# Patient Record
Sex: Female | Born: 2000 | Race: White | Hispanic: No | Marital: Single | Smoking: Never smoker
Health system: Southern US, Community
[De-identification: ages and names within clinical notes are randomized; demographics above are authoritative.]

## PROBLEM LIST (undated history)

## (undated) DIAGNOSIS — R51 Headache: Secondary | ICD-10-CM

## (undated) DIAGNOSIS — F419 Anxiety disorder, unspecified: Secondary | ICD-10-CM

## (undated) DIAGNOSIS — R519 Headache, unspecified: Secondary | ICD-10-CM

## (undated) DIAGNOSIS — K219 Gastro-esophageal reflux disease without esophagitis: Secondary | ICD-10-CM

---
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
## 2019-05-10 ENCOUNTER — Ambulatory Visit: Payer: BC Managed Care – PPO | Attending: Internal Medicine

## 2019-05-10 ENCOUNTER — Other Ambulatory Visit: Payer: Self-pay

## 2019-05-10 DIAGNOSIS — Z20822 Contact with and (suspected) exposure to covid-19: Secondary | ICD-10-CM

## 2019-05-11 LAB — NOVEL CORONAVIRUS, NAA: SARS-CoV-2, NAA: NOT DETECTED

## 2019-06-23 ENCOUNTER — Other Ambulatory Visit: Payer: Self-pay | Admitting: Psychiatry

## 2019-06-23 DIAGNOSIS — F330 Major depressive disorder, recurrent, mild: Secondary | ICD-10-CM

## 2019-06-23 DIAGNOSIS — F411 Generalized anxiety disorder: Secondary | ICD-10-CM

## 2019-06-23 DIAGNOSIS — F410 Panic disorder [episodic paroxysmal anxiety] without agoraphobia: Secondary | ICD-10-CM

## 2019-06-25 NOTE — Telephone Encounter (Signed)
Last seen 09/05/2018 planning follow-up in 5 months reminder at pickup with 30-day refill Pristiq 25 mg taking 3 every morning #90 with no refill

## 2019-06-25 NOTE — Telephone Encounter (Signed)
Last visit 08/2018

## 2019-07-18 ENCOUNTER — Other Ambulatory Visit: Payer: Self-pay

## 2019-07-18 ENCOUNTER — Ambulatory Visit (INDEPENDENT_AMBULATORY_CARE_PROVIDER_SITE_OTHER): Payer: BC Managed Care – PPO | Admitting: Psychiatry

## 2019-07-18 ENCOUNTER — Encounter: Payer: Self-pay | Admitting: Psychiatry

## 2019-07-18 VITALS — Ht 68.0 in | Wt 116.0 lb

## 2019-07-18 DIAGNOSIS — F411 Generalized anxiety disorder: Secondary | ICD-10-CM

## 2019-07-18 DIAGNOSIS — F410 Panic disorder [episodic paroxysmal anxiety] without agoraphobia: Secondary | ICD-10-CM | POA: Diagnosis not present

## 2019-07-18 DIAGNOSIS — F5105 Insomnia due to other mental disorder: Secondary | ICD-10-CM | POA: Diagnosis not present

## 2019-07-18 DIAGNOSIS — F429 Obsessive-compulsive disorder, unspecified: Secondary | ICD-10-CM | POA: Insufficient documentation

## 2019-07-18 DIAGNOSIS — F3341 Major depressive disorder, recurrent, in partial remission: Secondary | ICD-10-CM

## 2019-07-18 DIAGNOSIS — F428 Other obsessive-compulsive disorder: Secondary | ICD-10-CM

## 2019-07-18 MED ORDER — DESVENLAFAXINE SUCCINATE ER 100 MG PO TB24: 100.0000 mg | ORAL_TABLET | Freq: Every day | ORAL | 5 refills | Status: DC

## 2019-07-18 NOTE — Progress Notes (Signed)
[facility]  Patient ID: Janet Jenkins,  MRN: [id number]  PCP: Santa Genera, MD  Date of Evaluation: 07/18/2019 Time spent:25 minutes from 1400 to 1425  Chief Complaint:  Chief Complaint    Anxiety; Panic Attack; Depression; Stress      HISTORY/CURRENT STATUS: Janet Jenkins is seen onsite in office 20 minutes face-to-face individually with consent with epic collateral for psychiatric interview and exam in 57-month evaluation and management of anxiety, depression, compulsive behavior and insomnia interim symptoms.  Patient is 4 months overdue for follow-up though she suggests she only came in because her new therapist of 1 month in [place] for the last 3-4 sessions suggested she needed to do that to adjust medication for compulsive cleaning evident in her dorm room at [employer] state where she is on site this semester after being online last semester of her freshman year.  She plans to continue at [employer] and needs to get to where the dorm room is an asset rather than a vulnerability for her.  Brother is in [place] and supportive, and she comes home frequently to see mother still using the [facility] in [place].  She has continued Pristiq 75 mg every morning but her therapist notes the need for more for generalized anxiety and compulsive cleaning rituals and other OCD symptoms though she is only having a panic attack every other month now quite well controlled.  Her depression is in remission as well.  She may study abroad at the [place] if possible for coronavirus shutdowns having no other definite plans for the summer.  She has worked with a Data processing manager for 6 months getting her lactose intolerance under control and takes Lo Loestrin rather than Nexplanon for dysmenorrhea still with some acne and no menstrual moodiness.  Her legs shake when she tries to sleep as she is anxious over thinking attempting to go to sleep.  Therapy of 3 or 4 sessions is helping.  Sister has new diagnosis of OCD.  She  has no mania, suicidality, psychosis or delirium.  Depression  The patient presents withdepression as a recurrentproblem.The first episode started more than 2 year ago. The onset quality is sudden. The problem occurs intermittently. The current episode of problem has been gradually improvingsince onset.Associated symptoms include decreased concentration,fatigue,lonely, compulsive cleaning, excessive worry with limited symptom panic, obsessional fixations, and sad.The symptoms are aggravated by work stress, social issues and family issues.Past treatments include SSRIs - Selective serotonin reuptake inhibitors, other medications and psychotherapy.Compliance with treatment is variable.Past compliance problems include difficulty with treatment plan, medication issues and difficulty understanding directions.Previous treatment provided moderaterelief.Risk factors include family history of mental illness, a change in medications, a change in medication usage/dosage, major life event, the patient not taking medications correctly and stress. Past medical history includes brain trauma,anxiety,depressionand mental health disorder. Pertinent negatives include no fibromyalgia,no thyroid problem,no recent psychiatric admission,no bipolar disorder,no eating disorder,no post-traumatic stress disorder,no schizophreniaand no head trauma.   Individual Medical History/ Review of Systems: Changes? :Yes  She has worked with a dietitian for 6 months getting her lactose intolerance under control and takes low Loestrin rather than Nexplanon for dysmenorrhea still with some acne and no menstrual moodiness.  Weight is down 1 pound from 10 months ago.   Allergies: Patient has no known allergies.  Current Medications:  Current Outpatient Medications:  .  acetaminophen (TYLENOL) 325 MG tablet, Take 650 mg by mouth every 6 (six) hours as needed for mild pain or fever., Disp: , Rfl:  .   desvenlafaxine (PRISTIQ)  100 MG 24 hr tablet, Take 1 tablet (100 mg total) by mouth daily after breakfast., Disp: 30 tablet, Rfl: 5 .  etonogestrel (NEXPLANON) 68 MG IMPL implant, 1 each by Subdermal route once., Disp: , Rfl:  .  LO LOESTRIN FE 1 MG-10 MCG / 10 MCG tablet, TK 1 T PO QD, Disp: , Rfl:  .  ondansetron (ZOFRAN-ODT) 8 MG disintegrating tablet, ondansetron 8 mg disintegrating tablet, Disp: , Rfl:  .  tretinoin (RETIN-A) 0.1 % cream, Apply 1 application topically every morning., Disp: , Rfl:   Medication Side Effects: none  Family Medical/ Social History: Changes? None  MENTAL HEALTH EXAM:  Height 5\' 8"  (1.727 m), weight 116 lb (52.6 kg), unknown if currently breastfeeding.Body mass index is 17.64 kg/m. Muscle strengths and tone 5/5, postural reflexes and gait 0/0, and AIMS = 0 otherwise deferred for coronavirus shutdown  General Appearance: Casual, Fairly Groomed and Guarded  Eye Contact:  Good  Speech:  Clear and Coherent, Normal Rate and Talkative  Volume:  Normal  Mood:  Anxious, Dysphoric and Euthymic  Affect:  Congruent, Inappropriate, Restricted and Anxious  Thought Process:  Coherent, Goal Directed, Irrelevant and Descriptions of Associations: Circumstantial  Orientation:  Full (Time, Place, and Person)  Thought Content: Ilusions, Obsessions and Rumination   Suicidal Thoughts:  No  Homicidal Thoughts:  No  Memory:  Immediate;   Good Remote;   Good  Judgement:  Fair  Insight:  Fair  Psychomotor Activity:  Normal, Mannerisms and Restlessness  Concentration:  Concentration: Good and Attention Span: Good  Recall:  [place] of Knowledge: Good  Language: Good  Assets:  Leisure Time Resilience Talents/Skills  ADL's:  Intact  Cognition: WNL  Prognosis:  Good    DIAGNOSES:    ICD-10-CM   1. Generalized anxiety disorder  F41.1 desvenlafaxine (PRISTIQ) 100 MG 24 hr tablet  2. Panic disorder  F41.0 desvenlafaxine (PRISTIQ) 100 MG 24 hr tablet  3. Other  obsessive-compulsive disorders  F42.8 desvenlafaxine (PRISTIQ) 100 MG 24 hr tablet  4. 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 Depression, major, recurrent, in partial remission ([place])  F33.41     Receiving Psychotherapy: Yes Therapist in [place] for 3-4 sessions not giving name.   RECOMMENDATIONS: Over 50% of the 25-minute time for a total of 15 minutes is spent in counseling and coordination of care reworking all aspects of past involving diagnoses and therapeutic interventions.  The patient changed her pattern of treatment from somatic and psychological decompensations assimilating severe depression to now becoming infrequent in her appointments taking 1 medication and expecting it to cover all of her problems.  Her other OCD and generalized anxiety with panic disorder are currently chief symptomatic complaints with depression and insomnia better.  She gradually agrees to increase Pristiq to 100 mg every morning sent as a month supply and 5 refills to [facility].  She continues her birth control pill and other symptomatic medications including for acne.  Sister now has OCD. Patient's shaking legs interfere with class and other activities relative to generalized anxiety and prepanic.  She returns in 6 months for follow-up or sooner if willing having been overdue for appointments the last few times.Delight Hoh, MD

## 2019-07-28 ENCOUNTER — Other Ambulatory Visit: Payer: Self-pay | Admitting: Psychiatry

## 2019-07-28 DIAGNOSIS — F330 Major depressive disorder, recurrent, mild: Secondary | ICD-10-CM

## 2019-07-28 DIAGNOSIS — F411 Generalized anxiety disorder: Secondary | ICD-10-CM

## 2019-07-28 DIAGNOSIS — F410 Panic disorder [episodic paroxysmal anxiety] without agoraphobia: Secondary | ICD-10-CM

## 2019-09-01 IMAGING — US US ABDOMEN COMPLETE
1 series · 14 of 25 positions shown · non-contrast
Comparison: None.

CLINICAL DATA: Abdominal pain

EXAM:
ABDOMEN ULTRASOUND COMPLETE

[Series 1: us abdomen complete · 0.12mm/px · 14 of 79 slices shown]
[im 1/79]
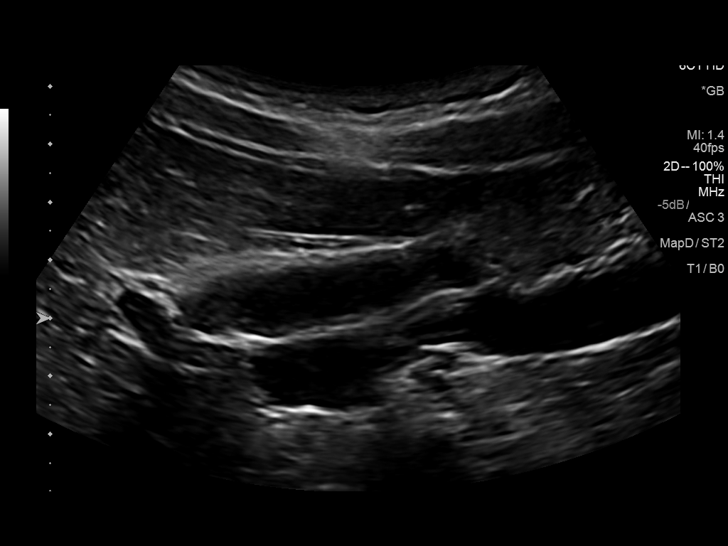
[im 7/79]
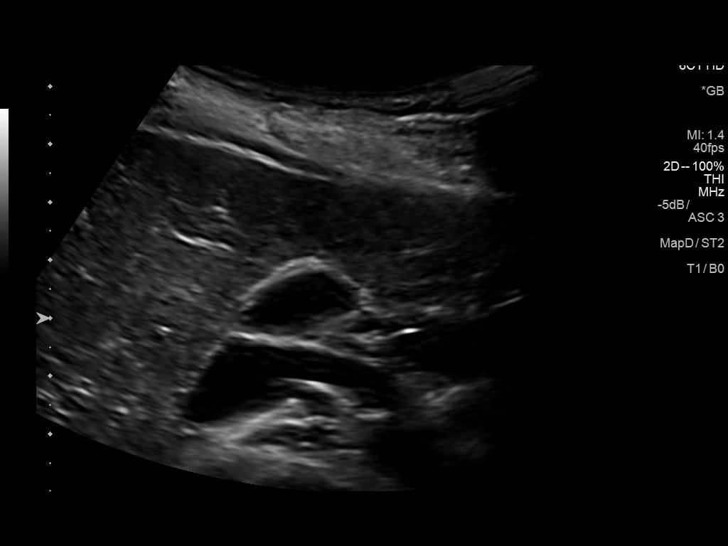
[im 14/79]
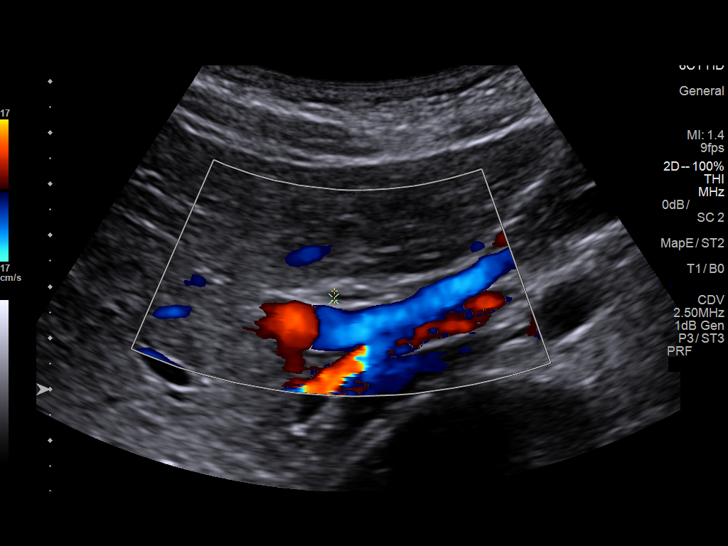
[im 20/79]
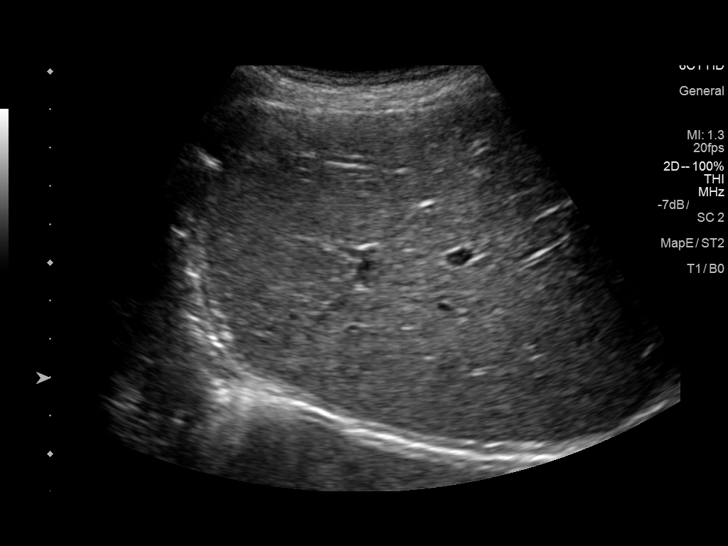
[im 27/79]
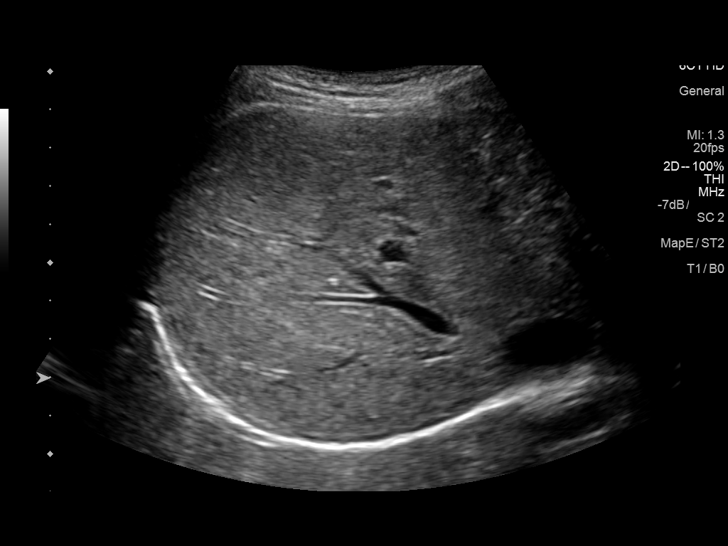
[im 30/79]
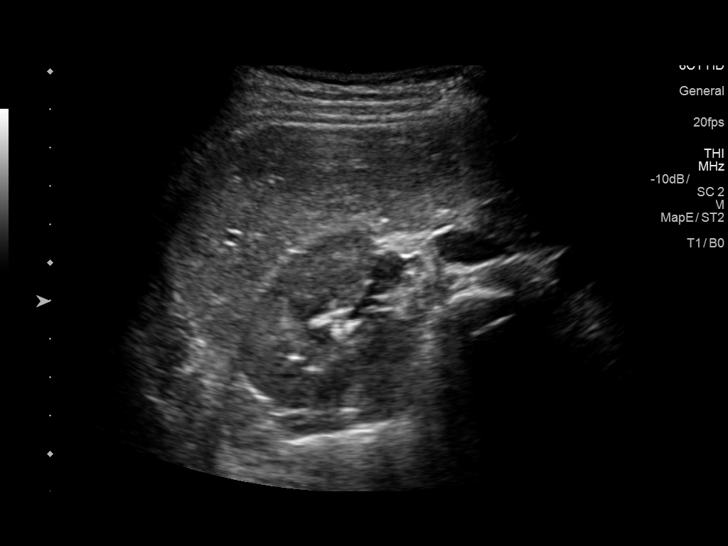
[im 36/79]
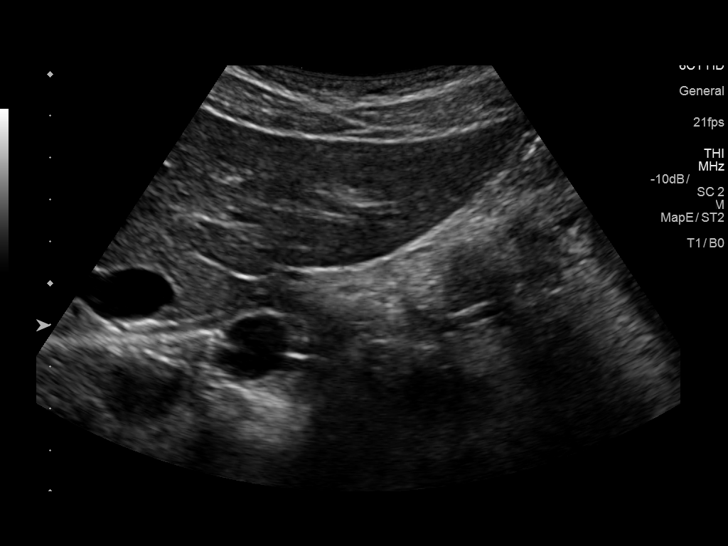
[im 43/79]
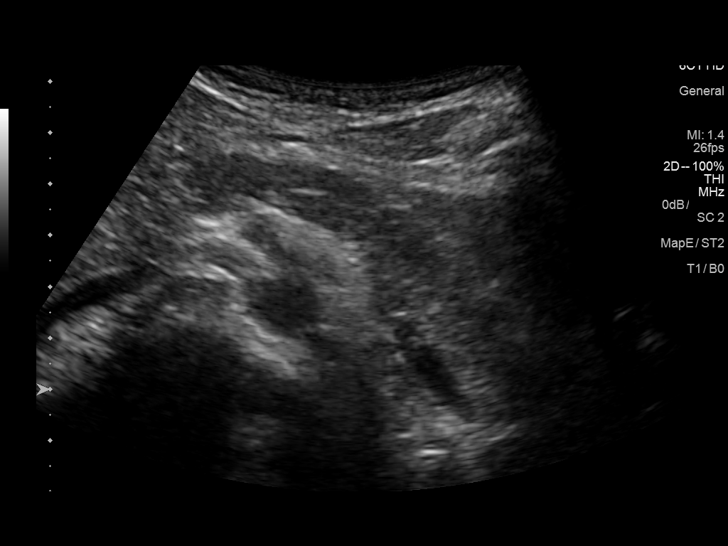
[im 49/79]
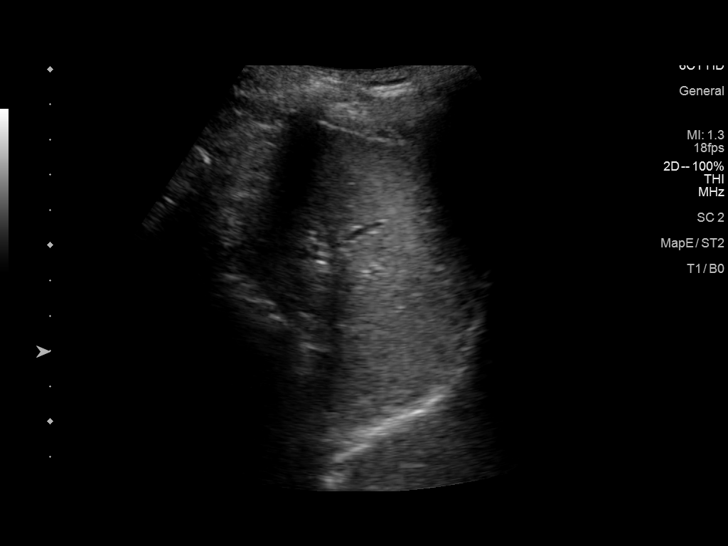
[im 53/79]
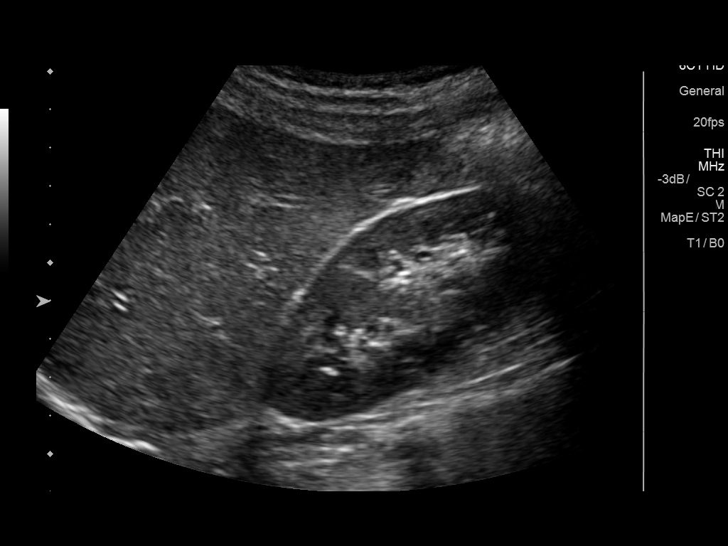
[im 59/79]
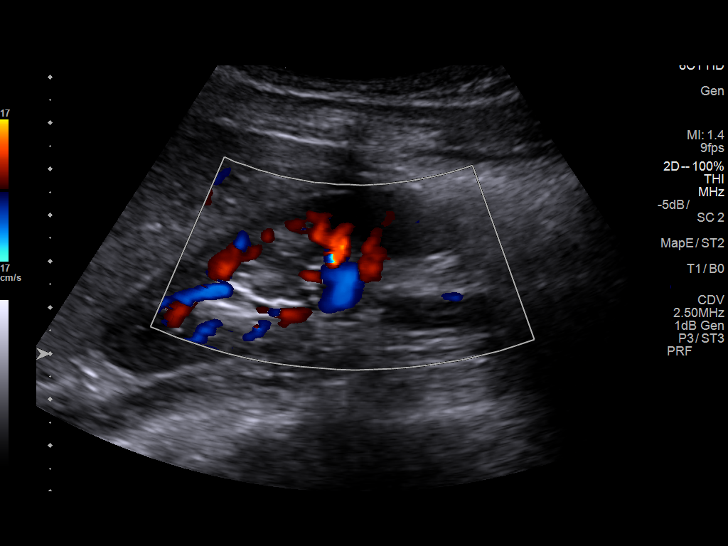
[im 66/79]
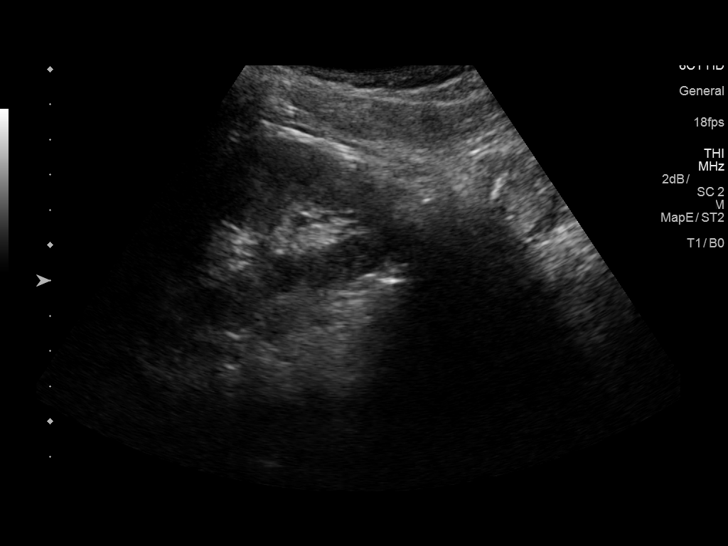
[im 72/79]
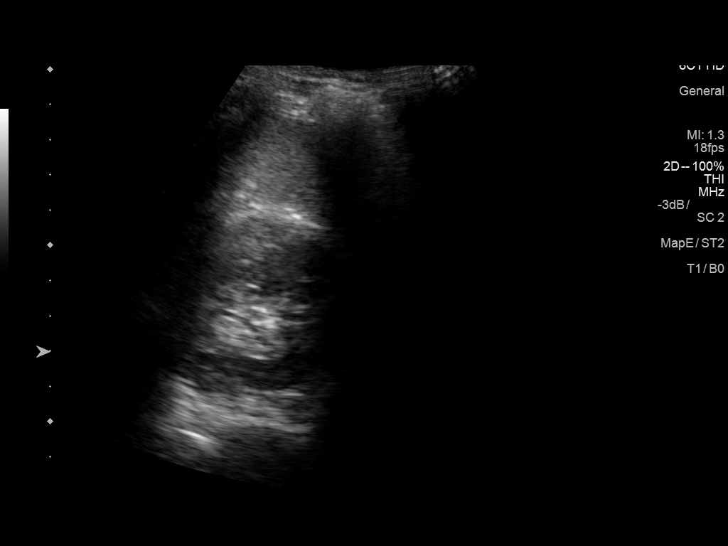
[im 79/79]
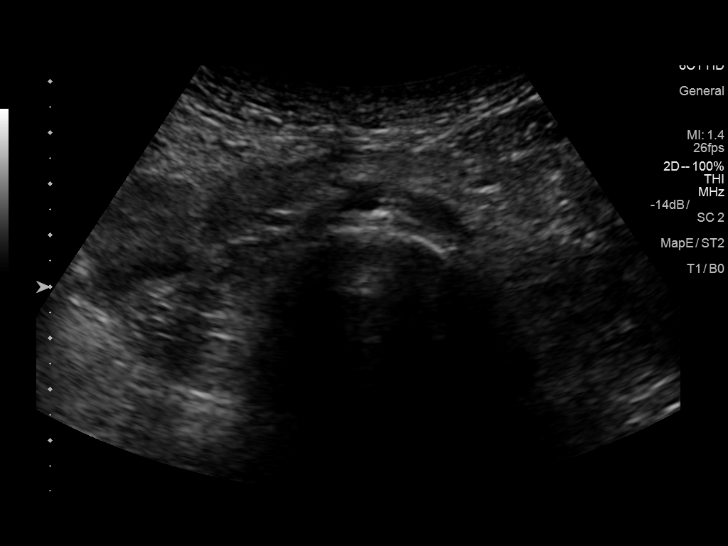

[14 of 25 positions shown; findings below may reference images not displayed]

FINDINGS: Gallbladder: No gallstones or wall thickening visualized. There is
no pericholecystic fluid. No sonographic Murphy sign noted by
sonographer.

Common bile duct: Diameter: 2 mm. No intrahepatic, common hepatic,
or common bile duct dilatation.

Liver: No focal lesion identified. Within normal limits in
parenchymal echogenicity. Portal vein is patent on color Doppler
imaging with normal direction of blood flow towards the liver.

IVC: No abnormality visualized.

Pancreas: No pancreatic mass or inflammatory focus.

Spleen: Size and appearance within normal limits.

Right Kidney: Length: 9.8 cm. Echogenicity within normal limits. No
mass or hydronephrosis visualized.

Left Kidney: Length: 10.0 cm. Echogenicity within normal limits. No
mass or hydronephrosis visualized.

Abdominal aorta: No aneurysm visualized.

Other findings: No demonstrable ascites.
IMPRESSION: Study within normal limits.

## 2019-09-05 IMAGING — US US PELVIS COMPLETE WITH TRANSVAGINAL
1 series · 14 of 25 positions shown · non-contrast
Comparison: None

CLINICAL DATA: Right lower quadrant pain



[Series 1: us pelvis complete with transvaginal · 0.17mm/px · 14 of 88 slices shown]
[im 1/88]
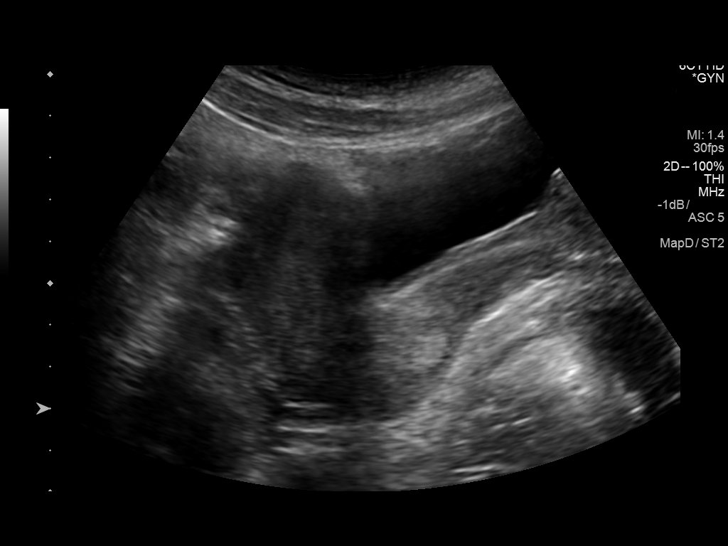
[im 8/88]
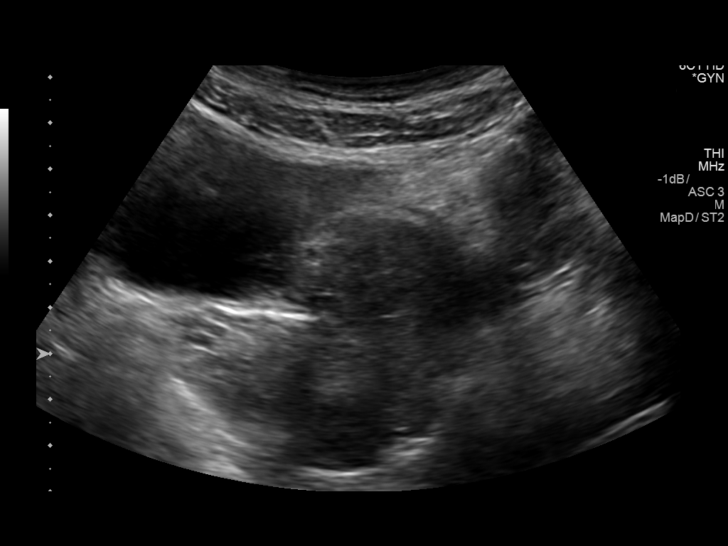
[im 15/88]
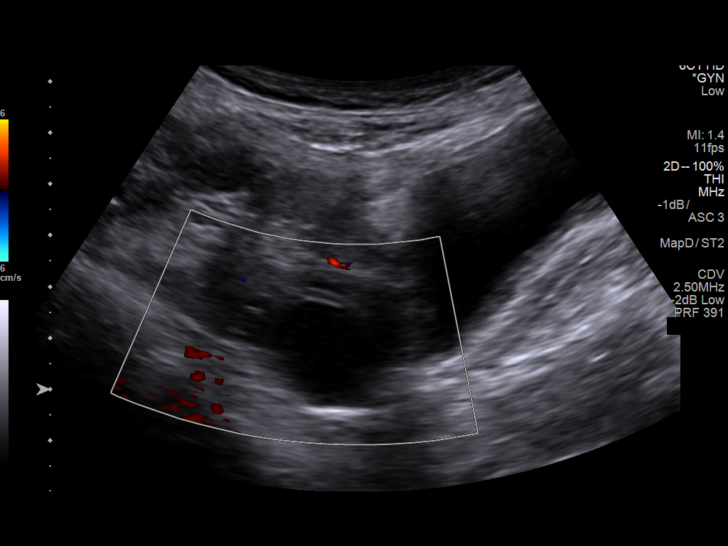
[im 22/88]
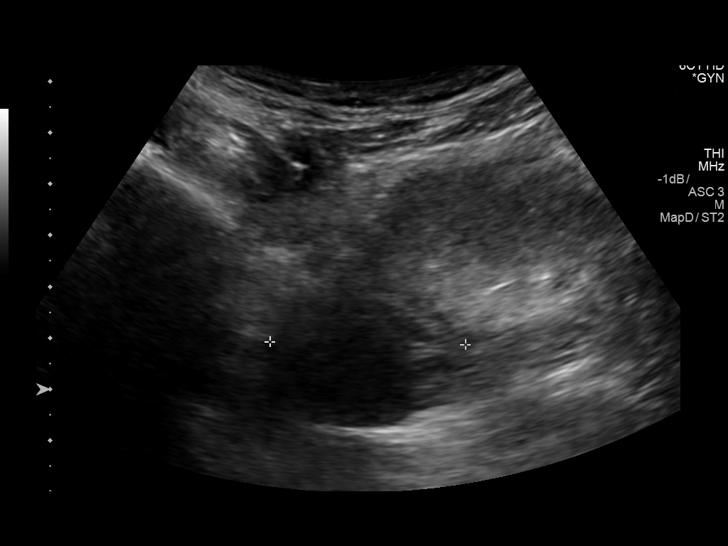
[im 30/88]
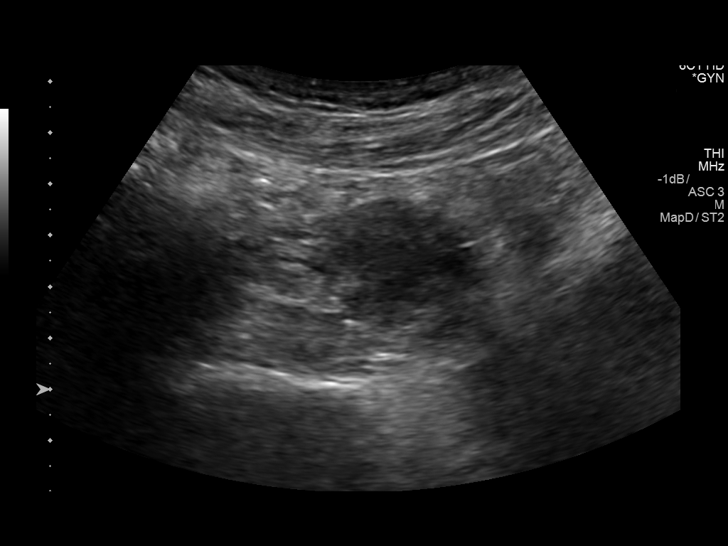
[im 33/88]
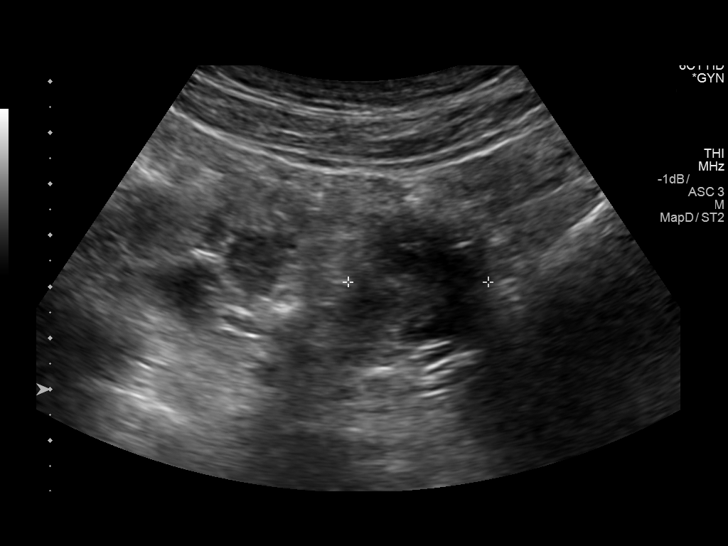
[im 40/88]
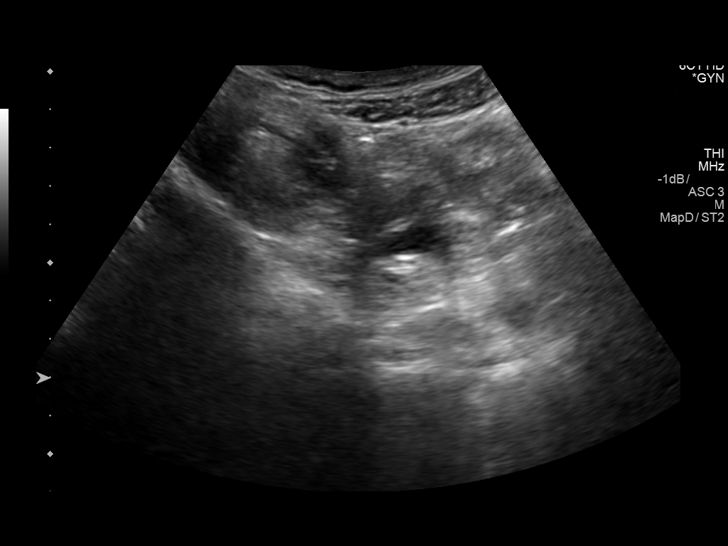
[im 48/88]
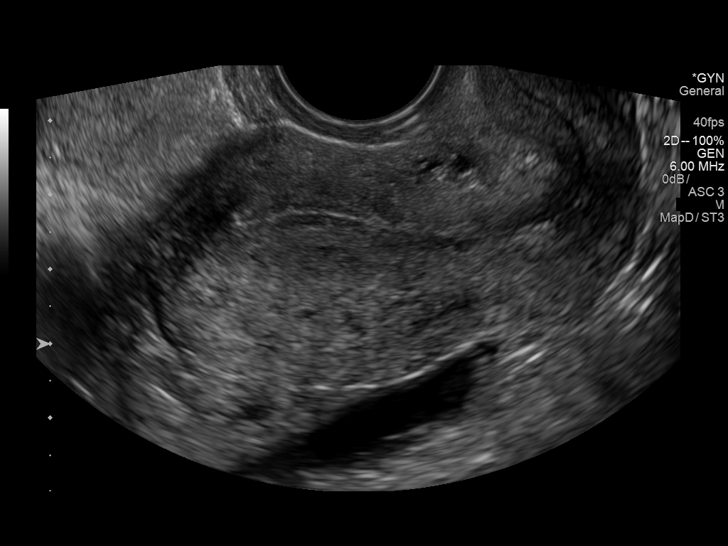
[im 55/88]
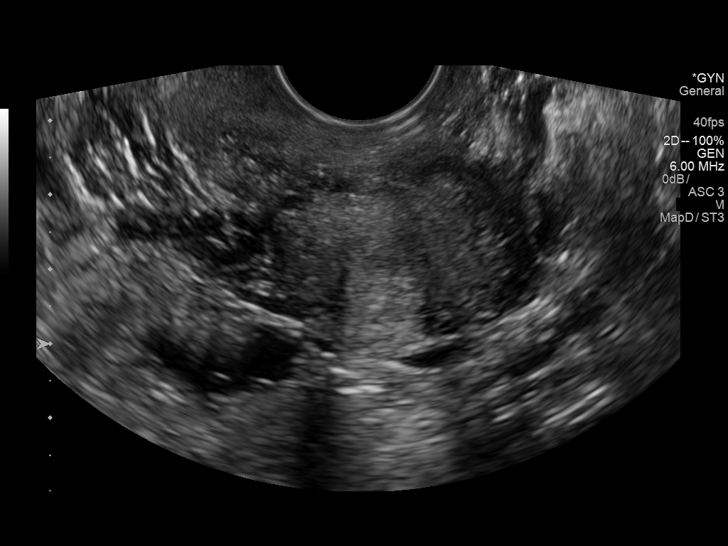
[im 59/88]
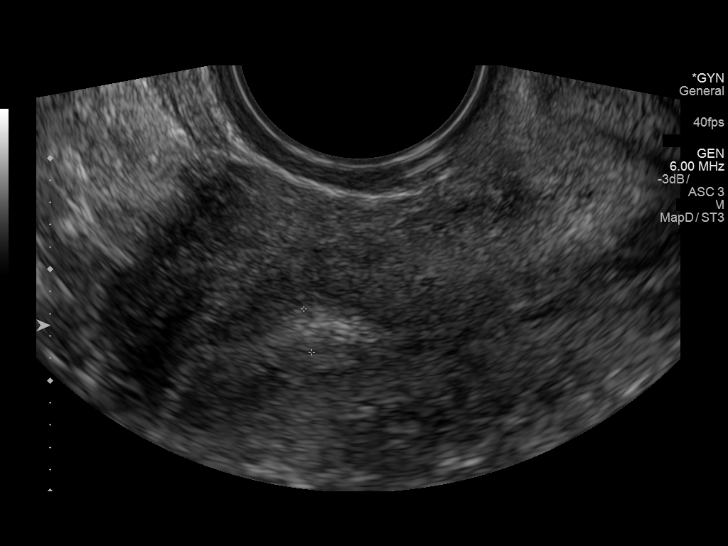
[im 66/88]
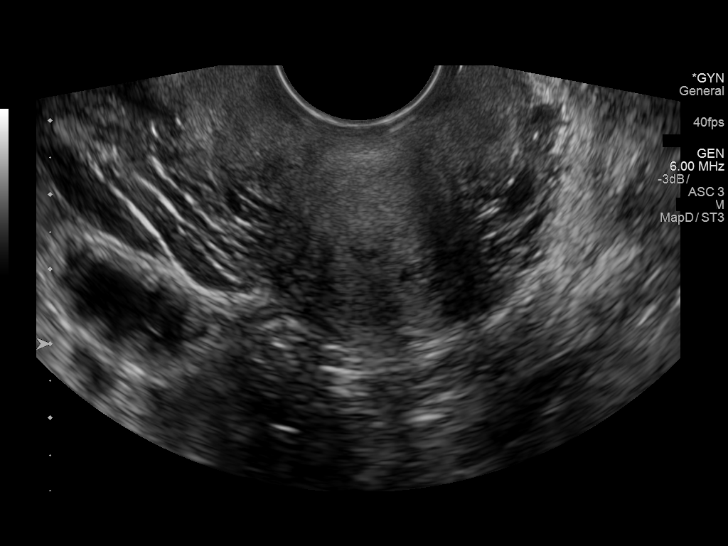
[im 73/88]
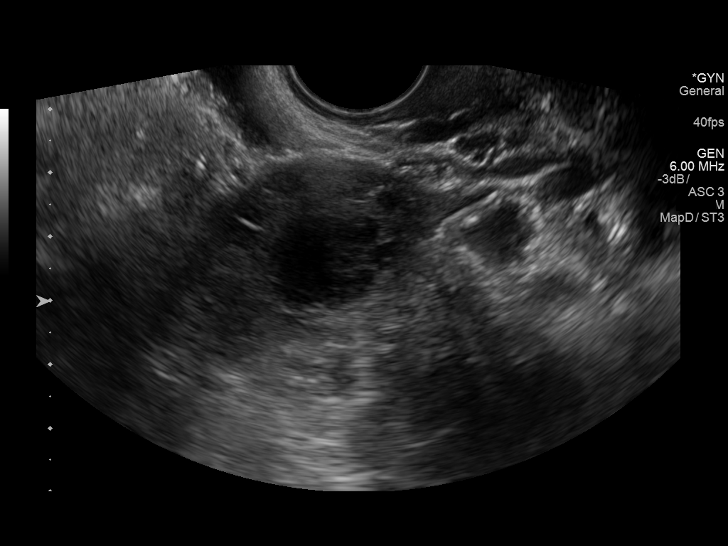
[im 80/88]
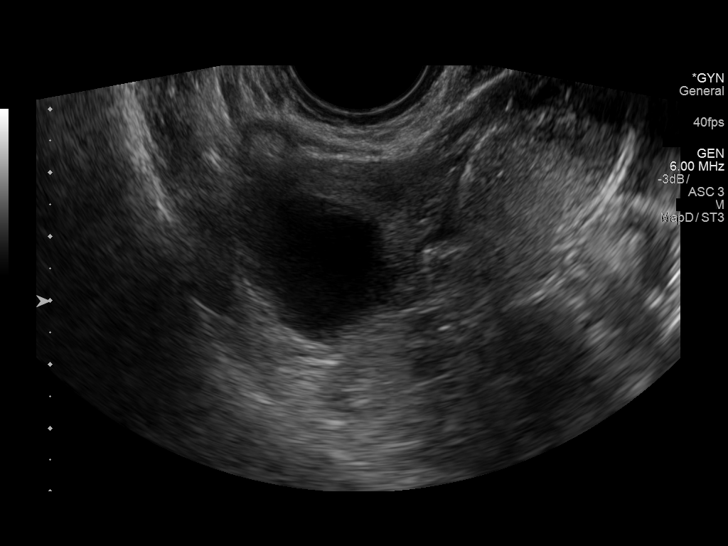
[im 88/88]
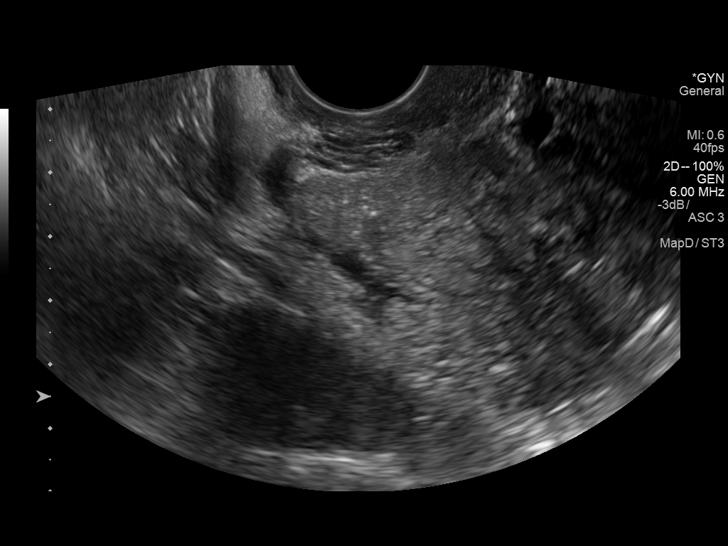

[14 of 25 positions shown; findings below may reference images not displayed]

FINDINGS: Uterus

Measurements: 6.9 x 3.5 x 3.8 cm = volume: 48 mL. No fibroids or
other mass visualized.

Endometrium

Thickness: 4 mm in thickness..  No focal abnormality visualized.

Right ovary

Measurements: 4.5 x 2.5 x 3.2 cm = volume: 19 mL. 2.3 cm dominant
follicle. Normal appearance. No adnexal mass.

Left ovary

Measurements: 3.7 x 2.5 x 2.7 cm = volume: 13 mL. Normal
appearance/no adnexal mass.

Other findings

Small amount of physiologic free fluid.
IMPRESSION: Unremarkable pelvic ultrasound.

## 2019-11-27 ENCOUNTER — Encounter: Payer: Self-pay | Admitting: Psychiatry

## 2019-11-27 ENCOUNTER — Other Ambulatory Visit: Payer: Self-pay

## 2019-11-27 ENCOUNTER — Ambulatory Visit (INDEPENDENT_AMBULATORY_CARE_PROVIDER_SITE_OTHER): Payer: BC Managed Care – PPO | Admitting: Psychiatry

## 2019-11-27 VITALS — Ht 68.0 in | Wt 113.0 lb

## 2019-11-27 DIAGNOSIS — F428 Other obsessive-compulsive disorder: Secondary | ICD-10-CM

## 2019-11-27 DIAGNOSIS — F5105 Insomnia due to other mental disorder: Secondary | ICD-10-CM

## 2019-11-27 DIAGNOSIS — F410 Panic disorder [episodic paroxysmal anxiety] without agoraphobia: Secondary | ICD-10-CM | POA: Diagnosis not present

## 2019-11-27 DIAGNOSIS — F3341 Major depressive disorder, recurrent, in partial remission: Secondary | ICD-10-CM | POA: Diagnosis not present

## 2019-11-27 DIAGNOSIS — F411 Generalized anxiety disorder: Secondary | ICD-10-CM

## 2019-11-27 MED ORDER — BUSPIRONE HCL 15 MG PO TABS: 15.0000 mg | ORAL_TABLET | Freq: Two times a day (BID) | ORAL | 5 refills | Status: DC

## 2019-11-27 MED ORDER — DESVENLAFAXINE SUCCINATE ER 100 MG PO TB24: 100.0000 mg | ORAL_TABLET | Freq: Every day | ORAL | 5 refills | Status: DC

## 2019-11-27 NOTE — Progress Notes (Signed)
[facility]  Patient ID: Janet Jenkins,  MRN: [id number]  PCP: Santa Genera, MD  Date of Evaluation: 11/27/2019 Time spent:25 minutes from 1000 to 1025  Chief Complaint:  Chief Complaint    Anxiety; Depression; Panic Attack      HISTORY/CURRENT STATUS: Janet Jenkins is seen Onsite in office 25 minutes face-to-face individually with consent with epic collateral for psychiatric interview and exam and 4.15-month evaluation and management of OCD, panic and generalized anxiety, insomnia and nightmares, and major depression remaining in partial remission.  In the interim, she completed her freshman year at [employer] state and has had a summer school class in physics at [employer] with an onsite lamp which was very interesting.  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  She has switched her therapist in [place] to the specialized psychologist for OCD doing exposure therapy now with hierarchy and claimed that she will do Onsite now that virtually months this past week.  Sister's OCD is treated with Lexapro and as needed hydroxyzine.  Patient inquires after talking with her therapist about something to augment the Pristiq which helps her panic so that she can resolve the panic internally before she decompensates outwardly and helps her depression.  Inquires about the Atarax like sister though we review her GeneSight which sister also had an consider BuSpar as possibly providing more of the needs she expresses.  She still has nightmares at times sleeps adequately.  He has no mania, suicidality, psychosis or delirium but she has lost 4 pounds in the interim planning to start nutritionist here this fall and working on restricting with her OCD therapist.  Depression The patient presents withdepressionasa recurrentproblem.Thefirstepisode started2year ago. The onset  quality is sudden. The problem occurs intermittently.The current episode ofproblem has been gradually improvingsince onset.Associated symptoms include decreased concentration,lonely,compulsive cleaning, excessive worry with limited symptom panic, obsessional fixations, and dietary restricting.Associated symptoms do not currently include fatigue, sadness, insomnia, mood swings, or suicidal ideation   the symptoms are aggravated by work stress, social issues and family issues.Past treatments include SSRIs - Selective serotonin reuptake inhibitors, other medications and psychotherapy.Compliance with treatment is variable.Past compliance problems include difficulty with treatment plan, medication issues and difficulty understanding directions.Previous treatment provided moderaterelief.Risk factors include family history of mental illness, a change in medications, a change in medication usage/dosage, major life event, the patient not taking medications correctly and stress. Past medical history includes brain trauma,anxiety, depression, eating disorder,and mental health disorder. Pertinent negatives include no fibromyalgia,no thyroid problem,no recent psychiatric admission,no bipolar disorder,no post-traumatic stress disorder,no schizophreniaand no head trauma.   Individual Medical History/ Review of Systems: Changes? :Yes The change over from the Nexplanon to low Loestrin has been successful.  The patient has lost 4 pounds in the last 4.5 months now seeing a new therapist also addressing restrictive eating and to start with nutrition again this fall in [place].  Allergies: Patient has no known allergies.  Current Medications:  Current Outpatient Medications:  .  acetaminophen (TYLENOL) 325 MG tablet, Take 650 mg by mouth every 6 (six) hours as needed for mild pain or fever., Disp: , Rfl:  .  busPIRone (BUSPAR) 15 MG tablet, Take 1 tablet (15 mg total) by mouth 2 (two)  times daily., Disp: 60 tablet, Rfl: 5 .  desvenlafaxine (PRISTIQ) 100 MG 24 hr tablet, Take 1 tablet (100 mg total) by mouth daily after breakfast.,  Disp: 30 tablet, Rfl: 5 .  etonogestrel (NEXPLANON) 68 MG IMPL implant, 1 each by Subdermal route once., Disp: , Rfl:  .  LO LOESTRIN FE 1 MG-10 MCG / 10 MCG tablet, TK 1 T PO QD, Disp: , Rfl:  .  ondansetron (ZOFRAN-ODT) 8 MG disintegrating tablet, ondansetron 8 mg disintegrating tablet, Disp: , Rfl:  .  tretinoin (RETIN-A) 0.1 % cream, Apply 1 application topically every morning., Disp: , Rfl:   Medication Side Effects: none  Family Medical/ Social History: Changes? Yes sister with OCD now is taking Lexapro and as needed Atarax.  MENTAL HEALTH EXAM:  Height 5\' 8"  (1.727 m), weight 113 lb (51.3 kg), unknown if currently breastfeeding.Body mass index is 17.18 kg/m. Muscle strengths and tone 5/5, postural reflexes and gait 0/0, and AIMS = 0.  General Appearance: Casual, Fairly Groomed and Meticulous  Eye Contact:  Good  Speech:  Clear and Coherent, Normal Rate and Talkative  Volume:  Normal  Mood:  Anxious, Dysphoric and Euthymic  Affect:  Congruent, Inappropriate, Restricted and Anxious  Thought Process:  Coherent, Goal Directed and Descriptions of Associations: Circumstantial  Orientation:  Full (Time, Place, and Person)  Thought Content: Ilusions, Obsessions and Rumination   Suicidal Thoughts:  No  Homicidal Thoughts:  No  Memory:  Immediate;   Good Remote;   Good  Judgement:  Fair  Insight:  Good  Psychomotor Activity:  Normal, Increased and Mannerisms  Concentration:  Concentration: Good and Attention Span: Good  Recall:  Good  Fund of Knowledge: Good  Language: Good  Assets:  Desire for Improvement Leisure Time Resilience Talents/Skills Vocational/Educational  ADL's:  Intact  Cognition: WNL  Prognosis:  Good    DIAGNOSES:    ICD-10-CM   1. Other obsessive-compulsive disorders  F42.8 desvenlafaxine (PRISTIQ) 100 MG 24  hr tablet    busPIRone (BUSPAR) 15 MG tablet  2. Generalized anxiety disorder  F41.1 desvenlafaxine (PRISTIQ) 100 MG 24 hr tablet    busPIRone (BUSPAR) 15 MG tablet  3. Panic disorder  F41.0 desvenlafaxine (PRISTIQ) 100 MG 24 hr tablet  4. Depression, major, recurrent, in partial remission ([facility])  F33.41 busPIRone (BUSPAR) 15 MG tablet  5. Insomnia disorder, with non-sleep disorder mental comorbidity, episodic  F51.05     Receiving Psychotherapy: Yes Has individual cognitive behavioral therapist for OCD and restrictive dieting as well as plans to restart nutrition therapy and [place] while at [employer] [facility].   RECOMMENDATIONS: Psychosupportive psychoeducation integrates cognitive behavioral exposure thought stopping habit reversal response prevention psychotherapy underway with symptom treatment matching for current and next of medications.  She starts nutrition therapy again soon. The patient indicates that she would not want to restart ziprasidone or anything in that family.  She asks about as needed Atarax like sister but agrees that she needs a structured medication each day.  She is pleased with the Pristiq but has additional treatment needs for generalized anxiety and augmentation of Pristiq for OCD, so that addition of BuSpar is likely best at this time.  We processed dosing variables and GeneSight addressing her general need for higher doses of medications for efficacy particularly for the OCD.  She is E scribed BuSpar 15 mg twice daily morning mid-to-late afternoon sent as #60 with 5 refills understanding the options of using half dosing morning and afternoon or advancing to 3 times a day the course of time if needed sent  to [facility].  She is E scribed Pristiq 100 mg every morning after breakfast sent as #30  with 5 refills to [facility] for OCD, panic disorder, generalized anxiety, and major depression.  She needs follow-up in 5 to 6 months or sooner if needed.   Chauncey Mann, MD

## 2020-01-16 ENCOUNTER — Ambulatory Visit: Payer: BC Managed Care – PPO | Admitting: Psychiatry

## 2020-02-13 ENCOUNTER — Encounter: Payer: Self-pay | Admitting: Psychiatry

## 2020-02-20 ENCOUNTER — Other Ambulatory Visit: Payer: Self-pay | Admitting: Psychiatry

## 2020-02-20 DIAGNOSIS — F428 Other obsessive-compulsive disorder: Secondary | ICD-10-CM

## 2020-02-20 DIAGNOSIS — F411 Generalized anxiety disorder: Secondary | ICD-10-CM

## 2020-02-20 DIAGNOSIS — F410 Panic disorder [episodic paroxysmal anxiety] without agoraphobia: Secondary | ICD-10-CM

## 2020-05-14 ENCOUNTER — Other Ambulatory Visit: Payer: Self-pay | Admitting: Psychiatry

## 2020-05-14 DIAGNOSIS — F428 Other obsessive-compulsive disorder: Secondary | ICD-10-CM

## 2020-05-14 DIAGNOSIS — F3341 Major depressive disorder, recurrent, in partial remission: Secondary | ICD-10-CM

## 2020-05-14 DIAGNOSIS — F411 Generalized anxiety disorder: Secondary | ICD-10-CM

## 2020-05-14 NOTE — Telephone Encounter (Signed)
Please get scheduled with new provider 

## 2020-06-05 NOTE — Telephone Encounter (Signed)
Left a message for pt to call and schedule °

## 2020-06-11 ENCOUNTER — Other Ambulatory Visit: Payer: Self-pay | Admitting: Psychiatry

## 2020-06-11 DIAGNOSIS — F411 Generalized anxiety disorder: Secondary | ICD-10-CM

## 2020-06-11 DIAGNOSIS — F428 Other obsessive-compulsive disorder: Secondary | ICD-10-CM

## 2020-06-11 DIAGNOSIS — F3341 Major depressive disorder, recurrent, in partial remission: Secondary | ICD-10-CM

## 2020-06-11 NOTE — Telephone Encounter (Signed)
Call to RS

## 2020-06-13 NOTE — Telephone Encounter (Signed)
Send 1 month. No RF

## 2020-07-10 ENCOUNTER — Other Ambulatory Visit: Payer: Self-pay | Admitting: Psychiatry

## 2020-07-10 DIAGNOSIS — F428 Other obsessive-compulsive disorder: Secondary | ICD-10-CM

## 2020-07-10 DIAGNOSIS — F3341 Major depressive disorder, recurrent, in partial remission: Secondary | ICD-10-CM

## 2020-07-10 DIAGNOSIS — F411 Generalized anxiety disorder: Secondary | ICD-10-CM

## 2020-07-11 NOTE — Telephone Encounter (Signed)
Please schedule appt

## 2020-07-11 NOTE — Telephone Encounter (Signed)
Left a message for pt to call and schedule an appt.

## 2020-07-11 NOTE — Telephone Encounter (Signed)
LM to call back for an appointment.

## 2020-07-11 NOTE — Telephone Encounter (Signed)
Marlyne Beards pt,not seen since August 2021.Not sure what to do with this one but did inform admins to schedule appt.

## 2022-01-20 ENCOUNTER — Other Ambulatory Visit: Payer: Self-pay | Admitting: Psychiatry

## 2022-01-20 DIAGNOSIS — F428 Other obsessive-compulsive disorder: Secondary | ICD-10-CM

## 2022-01-20 DIAGNOSIS — F411 Generalized anxiety disorder: Secondary | ICD-10-CM

## 2022-01-20 DIAGNOSIS — F410 Panic disorder [episodic paroxysmal anxiety] without agoraphobia: Secondary | ICD-10-CM

## 2022-07-06 ENCOUNTER — Encounter: Payer: Self-pay | Admitting: Gastroenterology

## 2022-07-06 ENCOUNTER — Ambulatory Visit (INDEPENDENT_AMBULATORY_CARE_PROVIDER_SITE_OTHER): Payer: BC Managed Care – PPO | Admitting: Gastroenterology

## 2022-07-06 VITALS — BP 110/74 | HR 90 | Temp 97.5°F | Ht 68.0 in | Wt 124.4 lb

## 2022-07-06 DIAGNOSIS — K5900 Constipation, unspecified: Secondary | ICD-10-CM

## 2022-07-06 NOTE — Patient Instructions (Signed)
I have given you samples of Linzess 290 mcg capsules. Take 1 capsule each morning, 30 minutes before breakfast.  Linzess works best when taken once a day every day, on an empty stomach, at least 30 minutes before your first meal of the day.  When Linzess is taken daily as directed:  *Constipation relief is typically felt in about a week *IBS-C patients may begin to experience relief from belly pain and overall abdominal symptoms (pain, discomfort, and bloating) in about 1 week,   with symptoms typically improving over 12 weeks.  Diarrhea may occur in the first 2 weeks -keep taking it.  The diarrhea should go away and you should start having normal, complete, full bowel movements. It may be helpful to start treatment when you can be near the comfort of your own bathroom, such as a weekend.    Please let me know how this works for you!! We may need to try a different therapy. If this works, I will send in a prescription.  Please message me on MyChart with any concerns or questions.  We will see you in May 2024!   It was a pleasure to see you today. I want to create trusting relationships with patients and provide genuine, compassionate, and quality care. I truly value your feedback, so please be on the lookout for a survey regarding your visit with me today. I appreciate your time in completing this!    Annitta Needs, PhD, ANP-BC [facility]

## 2022-07-06 NOTE — Progress Notes (Signed)
Gastroenterology Office Note    Referring Provider: Kandace Blitz, MD Primary Care Physician:  Kandace Blitz, MD  Primary GI: Dr. Abbey Chatters    Chief Complaint   Chief Complaint  Patient presents with   New Patient (Initial Visit)    Abd pain and constipation     History of Present Illness   Janet Jenkins is a 22 y.o. female presenting today as a new patient due to abdominal pain and constipation.    5 years ago had severe abdominal pain, intermittent nausea with eating. At first thought was GERD related. Had EGD, US abdomen, celiac serologies, pelvic ultrasound. Saw dietician. FODMAP diet. Did well on this but not sustainable. Dairy is a huge trigger. Symptoms kept returning. 2 years since last FODMAP trial.   Biggest issue is constipation. Feels constipated 75% of the time. [place]uea If eating sometimes. Notable bloating then decreased appetite associated with constipation. Will go 5-10 days without BM. Takes laxatives, then goes right back to constipation. Takes a long time for laaxatives to work. Senokot, colace, miralax, enemas   Paternal grandma: colon cancer. No FH colon polyps.   Sometims hard to pass stool even if right there. Wonders if pelvic floor dysfunction    Past Medical History:  Diagnosis Date   Anxiety    GERD (gastroesophageal reflux disease)    Headache     No past surgical history on file.  Current Outpatient Medications  Medication Sig Dispense Refill   acetaminophen (TYLENOL) 325 MG tablet Take 650 mg by mouth every 6 (six) hours as needed for mild pain or fever.     desvenlafaxine (PRISTIQ) 100 MG 24 hr tablet TAKE 1 TABLET(100 MG) BY MOUTH DAILY AFTER BREAKFAST 30 tablet 5   norelgestromin-ethinyl estradiol (ZAFEMY) 150-35 MCG/24HR transdermal patch Place 1 patch onto the skin once a week.     ondansetron (ZOFRAN-ODT) 8 MG disintegrating tablet ondansetron 8 mg disintegrating tablet     linaclotide (LINZESS) 290 MCG CAPS capsule Take 1 capsule  (290 mcg total) by mouth daily before breakfast. 90 capsule 3   No current facility-administered medications for this visit.    Allergies as of 07/06/2022   (No Known Allergies)    Family History  Problem Relation Age of Onset   Depression Mother     Social History   Socioeconomic History   Marital status: Single    Spouse name: Not on file   Number of children: Not on file   Years of education: Not on file   Highest education level: Not on file  Occupational History   Not on file  Tobacco Use   Smoking status: Never   Smokeless tobacco: Never  Vaping Use   Vaping Use: Never used  Substance and Sexual Activity   Alcohol use: Yes   Drug use: No   Sexual activity: Never  Other Topics Concern   Not on file  Social History Narrative   Pt lives at home with mom, dad, and sister.     Social Determinants of Health   Financial Resource Strain: Not on file  Food Insecurity: Not on file  Transportation Needs: Not on file  Physical Activity: Not on file  Stress: Not on file  Social Connections: Not on file  Intimate Partner Violence: Not on file     Review of Systems   Gen: Denies any fever, chills, fatigue, weight loss, lack of appetite.  CV: Denies chest pain, heart palpitations, peripheral edema, syncope.  Resp: Denies shortness of breath  at rest or with exertion. Denies wheezing or cough.  GI: Denies dysphagia or odynophagia. Denies jaundice, hematemesis, fecal incontinence. GU : Denies urinary burning, urinary frequency, urinary hesitancy MS: Denies joint pain, muscle weakness, cramps, or limitation of movement.  Derm: Denies rash, itching, dry skin Psych: Denies depression, anxiety, memory loss, and confusion Heme: Denies bruising, bleeding, and enlarged lymph nodes.   Physical Exam   BP 110/74   Pulse 90   Temp (!) 97.5 F (36.4 C)   Ht 5\' 8"  (1.727 m)   Wt 124 lb 6.4 oz (56.4 kg)   LMP 06/22/2022   BMI 18.91 kg/m  General:   Alert and oriented.  Pleasant and cooperative. Well-nourished and well-developed.  Head:  Normocephalic and atraumatic. Eyes:  Without icterus Ears:  Normal auditory acuity. Lungs:  Clear to auscultation bilaterally.  Heart:  S1, S2 present without murmurs appreciated.  Abdomen:  +BS, soft, non-tender and non-distended. No HSM noted. No guarding or rebound. No masses appreciated.  Rectal:  Deferred  Msk:  Symmetrical without gross deformities. Normal posture. Extremities:  Without edema. Neurologic:  Alert and  oriented x4;  grossly normal neurologically. Skin:  Intact without significant lesions or rashes. Psych:  Alert and cooperative. Normal mood and affect.   Assessment   Janet Jenkins is a 22 y.o. female presenting today due to abdominal pain and constipation.  Long-standing symptoms dating back at least 5 years, with evaluation as noted above. Failing multiple OTC agents. Will trial Linzess 290 mcg once daily. She will let me know how this works for her. Unable to exclude element of pelvic floor dysfunction. May need further evaluation if not ideal results.      PLAN   Linzess 290 mcg once daily  Progress report in several weeks  Return in May 2024   Annitta Needs, PhD, [facility]

## 2022-07-18 MED ORDER — LINACLOTIDE 290 MCG PO CAPS: 290.0000 ug | ORAL_CAPSULE | Freq: Every day | ORAL | 3 refills | Status: AC

## 2022-08-30 ENCOUNTER — Encounter: Payer: Self-pay | Admitting: Gastroenterology

## 2024-01-20 ENCOUNTER — Other Ambulatory Visit: Payer: Self-pay | Admitting: Gastroenterology
